# Patient Record
Sex: Male | Born: 2000
Health system: Southern US, Community
[De-identification: ages and names within clinical notes are randomized; demographics above are authoritative.]

## PROBLEM LIST (undated history)

## (undated) DIAGNOSIS — L0100 Impetigo, unspecified: Secondary | ICD-10-CM

---
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
## 2021-01-05 ENCOUNTER — Other Ambulatory Visit: Payer: Self-pay

## 2021-01-05 ENCOUNTER — Ambulatory Visit
Admission: RE | Admit: 2021-01-05 | Discharge: 2021-01-05 | Disposition: A | Discharge status: Home / Self Care | Payer: No Typology Code available for payment source | Source: Ambulatory Visit | Attending: Emergency Medicine | Admitting: Emergency Medicine

## 2021-01-05 VITALS — BP 136/97 | HR 91 | Temp 98.7°F | Resp 18

## 2021-01-05 DIAGNOSIS — L0100 Impetigo, unspecified: Secondary | ICD-10-CM | POA: Diagnosis not present

## 2021-01-05 DIAGNOSIS — R030 Elevated blood-pressure reading, without diagnosis of hypertension: Secondary | ICD-10-CM

## 2021-01-05 HISTORY — DX: Impetigo, unspecified: L01.00

## 2021-01-05 MED ORDER — MUPIROCIN 2 % EX OINT: 1.0000 "application " | TOPICAL_OINTMENT | Freq: Two times a day (BID) | CUTANEOUS | 0 refills | Status: DC

## 2021-01-05 MED ORDER — SULFAMETHOXAZOLE-TRIMETHOPRIM 800-160 MG PO TABS: 1.0000 | ORAL_TABLET | Freq: Two times a day (BID) | ORAL | 0 refills | Status: AC

## 2021-01-05 NOTE — ED Provider Notes (Signed)
[name]    CSN: [id number] Arrival date & time: 01/05/21  1708      History   Chief Complaint Chief Complaint  Patient presents with   Rash   APPT 1715    HPI Hector Harris is a 20 y.o. male.  Patient presents with 2-week history of a rash on his lower back/upper buttocks.  The rash is getting worse and spreading.  He states it is pruritic but also tender.  He also reports yellow drainage and crusting.  Treatment attempted at home with cortisone cream.  He denies fever, chills, or other symptoms.  He states this is similar to impetigo that he had several years ago.  The history is provided by the patient and medical records.   Past Medical History:  Diagnosis Date   Impetigo     Patient Active Problem List   Diagnosis Date Noted   Obesity 12/30/2014   Elevated hemoglobin A1c 04/18/2014   Elevated LDL cholesterol level 04/18/2014   Gynecomastia 04/18/2014   Morbid obesity ([facility]) 04/18/2014    Past Surgical History:  Procedure Laterality Date   CIRCUMCISION N/A 2005       Home Medications    Prior to Admission medications   Medication Sig Start Date End Date Taking? Authorizing Provider  mupirocin ointment (BACTROBAN) 2 % Apply 1 application topically 2 (two) times daily. 01/05/21  Yes Mickie Bail, NP  sulfamethoxazole-trimethoprim (BACTRIM DS) 800-160 MG tablet Take 1 tablet by mouth 2 (two) times daily for 7 days. 01/05/21 01/12/21 Yes Mickie Bail, NP  chlorhexidine (PERIDEX) 0.12 % solution RINSE MOUTH WITH (1 CAPFUL) FOR 30 SECONDS EVERY MORNING AND EVERY EVENING AFTER TOOTHBRUSHING. EXPECTORATE AFTER RINSING, DO NOT SWALLOW 05/21/20 05/21/21  Rosendo Gros, DMD  tretinoin (RETIN-A) 0.025 % cream  04/26/16   [provider]  valACYclovir (VALTREX) 500 MG tablet take one tablet by mouth at first sign of attack, then 4 tablets 12 hours later. 11/11/20       Family History Family History  Problem Relation Age of Onset   Hyperlipidemia  Father    Obesity Father    Hypertension Father    Cancer Maternal Grandmother    Hyperlipidemia Maternal Grandmother    Hypertension Maternal Grandmother    Diabetes Paternal Grandmother    Hyperlipidemia Paternal Grandmother    Obesity Paternal Grandmother    Hypertension Paternal Grandmother    Diabetes Paternal Grandfather    Hyperlipidemia Paternal Grandfather    Obesity Paternal Grandfather    Hypertension Paternal Grandfather    Obesity Mother     Social History Social History   Tobacco Use   Smoking status: Never   Smokeless tobacco: Never  Vaping Use   Vaping Use: Never used  Substance Use Topics   Drug use: Yes    Types: Marijuana     Allergies   Patient has no known allergies.   Review of Systems Review of Systems  Constitutional:  Negative for chills and fever.  Respiratory:  Negative for cough and shortness of breath.   Cardiovascular:  Negative for chest pain and palpitations.  Gastrointestinal:  Negative for abdominal pain and vomiting.  Skin:  Positive for color change and rash.  All other systems reviewed and are negative.   Physical Exam Triage Vital Signs ED Triage Vitals  Enc Vitals Group     BP      Pulse      Resp      Temp  Temp src      SpO2      Weight      Height      Head Circumference      Peak Flow      Pain Score      Pain Loc      Pain Edu?      Excl. in GC?    No data found.  Updated Vital Signs BP (!) 136/97 (BP Location: Left Arm)   Pulse 91   Temp 98.7 F (37.1 C) (Oral)   Resp 18   SpO2 97%   Visual Acuity Right Eye Distance:   Left Eye Distance:   Bilateral Distance:    Right Eye Near:   Left Eye Near:    Bilateral Near:     Physical Exam Vitals and nursing note reviewed.  Constitutional:      General: He is not in acute distress.    Appearance: He is well-developed.  HENT:     Head: Normocephalic and atraumatic.     Mouth/Throat:     Mouth: Mucous membranes are moist.  Eyes:      Conjunctiva/sclera: Conjunctivae normal.  Cardiovascular:     Rate and Rhythm: Normal rate and regular rhythm.     Heart sounds: Normal heart sounds.  Pulmonary:     Effort: Pulmonary effort is normal. No respiratory distress.     Breath sounds: Normal breath sounds.  Abdominal:     Palpations: Abdomen is soft.     Tenderness: There is no abdominal tenderness.  Musculoskeletal:        General: Normal range of motion.     Cervical back: Neck supple.  Skin:    General: Skin is warm and dry.     Findings: Erythema and lesion present.     Comments: Red papular and patchy rash on lower back and upper buttocks.  Honey colored crusting; no current drainage.  Localized erythema.  Neurological:     General: No focal deficit present.     Mental Status: He is alert and oriented to person, place, and time.     Sensory: No sensory deficit.     Motor: No weakness.     Gait: Gait normal.  Psychiatric:        Mood and Affect: Mood normal.        Behavior: Behavior normal.     UC Treatments / Results  Labs (all labs ordered are listed, but only abnormal results are displayed) Labs Reviewed - No data to display  EKG   Radiology No results found.  Procedures Procedures (including critical care time)  Medications Ordered in UC Medications - No data to display  Initial Impression / Assessment and Plan / UC Course  I have reviewed the triage vital signs and the nursing notes.  Pertinent labs & imaging results that were available during my care of the patient were reviewed by me and considered in my medical decision making (see chart for details).  Impetigo.  Elevated blood pressure reading.  Treating with Bactrim DS and mupirocin ointment.  Wound care instructions and signs of worsening infection discussed.  Instructed patient to follow-up with his PCP if his symptoms are not improving.  Also discussed that his blood pressure is elevated today needs to be rechecked by his PCP in 2 to 4  weeks.  He agrees to plan of care.   Final Clinical Impressions(s) / UC Diagnoses   Final diagnoses:  Impetigo  Elevated blood pressure reading  Discharge Instructions      Keep your wound clean and dry.  Wash it gently twice a day with soap and water.  Apply the antibiotic cream twice a day.  Take the antibiotic as directed.  Follow up with your primary care provider if your symptoms are not improving.    Your blood pressure is elevated today at 136/97.  Please have this rechecked by your primary care provider in 2-4 weeks.          ED Prescriptions     Medication Sig Dispense Auth. Provider   mupirocin ointment (BACTROBAN) 2 % Apply 1 application topically 2 (two) times daily. 22 g Mickie Bail, NP   sulfamethoxazole-trimethoprim (BACTRIM DS) 800-160 MG tablet Take 1 tablet by mouth 2 (two) times daily for 7 days. 14 tablet Mickie Bail, NP      PDMP not reviewed this encounter.   Mickie Bail, NP 01/05/21 [phone]

## 2021-01-05 NOTE — ED Triage Notes (Addendum)
Patient c/o rash x 2 weeks.   Patient endorses rash has worsened since onset of symptoms. Patient states " it seems to be spreading".   Patient endorses itching.Patient endorses "yellow drainage" from some sites.   Patient endorses rash is present on "back and top of butt".   Patient also has rash present on LFT upper arm and LFT leg.   Patient has used cortisone cream w/ no relief of symptoms.

## 2021-01-05 NOTE — Discharge Instructions (Addendum)
Keep your wound clean and dry.  Wash it gently twice a day with soap and water.  Apply the antibiotic cream twice a day.  Take the antibiotic as directed.  Follow up with your primary care provider if your symptoms are not improving.    Your blood pressure is elevated today at 136/97.  Please have this rechecked by your primary care provider in 2-4 weeks.

## 2021-03-03 ENCOUNTER — Other Ambulatory Visit (HOSPITAL_COMMUNITY): Payer: Self-pay

## 2021-04-04 ENCOUNTER — Other Ambulatory Visit: Payer: Self-pay

## 2021-04-06 ENCOUNTER — Other Ambulatory Visit: Payer: Self-pay

## 2021-06-23 ENCOUNTER — Other Ambulatory Visit: Payer: Self-pay

## 2021-06-23 ENCOUNTER — Ambulatory Visit
Admission: EM | Admit: 2021-06-23 | Discharge: 2021-06-23 | Disposition: A | Discharge status: Home / Self Care | Payer: No Typology Code available for payment source | Attending: Emergency Medicine | Admitting: Emergency Medicine

## 2021-06-23 ENCOUNTER — Encounter: Payer: Self-pay | Admitting: Emergency Medicine

## 2021-06-23 DIAGNOSIS — R300 Dysuria: Secondary | ICD-10-CM | POA: Insufficient documentation

## 2021-06-23 DIAGNOSIS — Z113 Encounter for screening for infections with a predominantly sexual mode of transmission: Secondary | ICD-10-CM | POA: Diagnosis not present

## 2021-06-23 LAB — POCT URINALYSIS DIP (MANUAL ENTRY)
Bilirubin, UA: NEGATIVE
Blood, UA: NEGATIVE
Glucose, UA: NEGATIVE mg/dL
Ketones, POC UA: NEGATIVE mg/dL
Leukocytes, UA: NEGATIVE
Nitrite, UA: NEGATIVE
Protein Ur, POC: NEGATIVE mg/dL
Spec Grav, UA: 1.025 (ref 1.010–1.025)
Urobilinogen, UA: 0.2 E.U./dL
pH, UA: 6.5 (ref 5.0–8.0)

## 2021-06-23 NOTE — Discharge Instructions (Addendum)
Your urine is normal.    Your other tests are pending.  If your test results are positive, we will call you.  You may require treatment at that time.  Do not have sexual activity for at least 7 days.    Follow up with your primary care provider if your symptoms are not improving.

## 2021-06-23 NOTE — ED Triage Notes (Signed)
Pt here with burning during urination x 2 weeks. Pt is sexually active, but denies any new sexual partners. Denies penile discharge, odor, abdominal pain or testicular pain.

## 2021-06-23 NOTE — ED Provider Notes (Signed)
Hector Harris    CSN: [id number] Arrival date & time: 06/23/21  1816      History   Chief Complaint Chief Complaint  Patient presents with   Dysuria    HPI Hector Harris is a 21 y.o. male.  Patient presents with 2-week history of burning with urination.  He is sexually active in a monogamous relationship and does not use condoms.  He denies penile discharge, testicular pain, fever, chills, rash, hematuria, abdominal pain, flank pain, or other symptoms.  No treatments at home.  The history is provided by the patient and medical records.   Past Medical History:  Diagnosis Date   Impetigo     Patient Active Problem List   Diagnosis Date Noted   Obesity 12/30/2014   Elevated hemoglobin A1c 04/18/2014   Elevated LDL cholesterol level 04/18/2014   Gynecomastia 04/18/2014   Morbid obesity ([place]) 04/18/2014    Past Surgical History:  Procedure Laterality Date   CIRCUMCISION N/A 2005       Home Medications    Prior to Admission medications   Medication Sig Start Date End Date Taking? Authorizing Provider  mupirocin ointment (BACTROBAN) 2 % Apply 1 application topically 2 (two) times daily. 01/05/21   Sharion Balloon, NP  tretinoin (RETIN-A) 0.025 % cream  04/26/16   [provider]  valACYclovir (VALTREX) 500 MG tablet take 4 tablets by mouth at first sign of attack, then 4 tablets 12 hours later. 11/11/20       Family History Family History  Problem Relation Age of Onset   Hyperlipidemia Father    Obesity Father    Hypertension Father    Cancer Maternal Grandmother    Hyperlipidemia Maternal Grandmother    Hypertension Maternal Grandmother    Diabetes Paternal Grandmother    Hyperlipidemia Paternal Grandmother    Obesity Paternal Grandmother    Hypertension Paternal Grandmother    Diabetes Paternal Grandfather    Hyperlipidemia Paternal Grandfather    Obesity Paternal Grandfather    Hypertension Paternal Grandfather    Obesity Mother      Social History Social History   Tobacco Use   Smoking status: Never   Smokeless tobacco: Never  Vaping Use   Vaping Use: Never used  Substance Use Topics   Drug use: Yes    Types: Marijuana     Allergies   Patient has no known allergies.   Review of Systems Review of Systems  Constitutional:  Negative for chills and fever.  Gastrointestinal:  Negative for abdominal pain, nausea and vomiting.  Genitourinary:  Positive for dysuria. Negative for flank pain, hematuria, penile discharge and testicular pain.  Skin:  Negative for color change and rash.  All other systems reviewed and are negative.   Physical Exam Triage Vital Signs ED Triage Vitals  Enc Vitals Group     BP 06/23/21 1855 (!) 163/89     Pulse Rate 06/23/21 1855 (!) 102     Resp 06/23/21 1855 18     Temp 06/23/21 1855 98.1 F (36.7 C)     Temp src --      SpO2 06/23/21 1855 98 %     Weight --      Height --      Head Circumference --      Peak Flow --      Pain Score 06/23/21 1858 4     Pain Loc --      Pain Edu? --      Excl. in  GC? --    No data found.  Updated Vital Signs BP (!) 163/89 (BP Location: Left Arm)    Pulse (!) 102    Temp 98.1 F (36.7 C)    Resp 18    SpO2 98%   Visual Acuity Right Eye Distance:   Left Eye Distance:   Bilateral Distance:    Right Eye Near:   Left Eye Near:    Bilateral Near:     Physical Exam Vitals and nursing note reviewed.  Constitutional:      General: He is not in acute distress.    Appearance: He is well-developed. He is not ill-appearing.  HENT:     Mouth/Throat:     Mouth: Mucous membranes are moist.  Cardiovascular:     Rate and Rhythm: Normal rate and regular rhythm.     Heart sounds: Normal heart sounds.  Pulmonary:     Effort: Pulmonary effort is normal. No respiratory distress.     Breath sounds: Normal breath sounds.  Abdominal:     Palpations: Abdomen is soft.     Tenderness: There is no abdominal tenderness. There is no right  CVA tenderness, left CVA tenderness, guarding or rebound.  Genitourinary:    Penis: Normal.      Testes: Normal.  Musculoskeletal:     Cervical back: Neck supple.  Skin:    General: Skin is warm and dry.     Findings: No lesion or rash.  Neurological:     Mental Status: He is alert.  Psychiatric:        Mood and Affect: Mood normal.        Behavior: Behavior normal.     UC Treatments / Results  Labs (all labs ordered are listed, but only abnormal results are displayed) Labs Reviewed  POCT URINALYSIS DIP (MANUAL ENTRY)  CYTOLOGY, (ORAL, ANAL, URETHRAL) ANCILLARY ONLY    EKG   Radiology No results found.  Procedures Procedures (including critical care time)  Medications Ordered in UC Medications - No data to display  Initial Impression / Assessment and Plan / UC Course  I have reviewed the triage vital signs and the nursing notes.  Pertinent labs & imaging results that were available during my care of the patient were reviewed by me and considered in my medical decision making (see chart for details).   Dysuria, screening for STDs.  Urine normal.  Patient obtained urethral self swab for testing.  Discussed that we will call if test results are positive.  Discussed that he may require treatment at that time.  Instructed patient to abstain from sexual activity for at least 7 days.  Instructed him to follow-up with PCP if his symptoms are not improving.  Patient agrees to plan of care.    Final Clinical Impressions(s) / UC Diagnoses   Final diagnoses:  Dysuria  Screening for STD (sexually transmitted disease)     Discharge Instructions      Your urine is normal.    Your other tests are pending.  If your test results are positive, we will call you.  You may require treatment at that time.  Do not have sexual activity for at least 7 days.    Follow up with your primary care provider if your symptoms are not improving.        ED Prescriptions   None     PDMP not reviewed this encounter.   Sharion Balloon, NP 06/23/21 1930

## 2021-06-24 LAB — CYTOLOGY, (ORAL, ANAL, URETHRAL) ANCILLARY ONLY
Chlamydia: POSITIVE — AB
Comment: NEGATIVE
Comment: NEGATIVE
Comment: NORMAL
Neisseria Gonorrhea: NEGATIVE
Trichomonas: NEGATIVE

## 2021-06-25 ENCOUNTER — Telehealth (HOSPITAL_COMMUNITY): Payer: Self-pay | Admitting: Emergency Medicine

## 2021-06-25 MED ORDER — DOXYCYCLINE HYCLATE 100 MG PO CAPS: 100.0000 mg | ORAL_CAPSULE | Freq: Two times a day (BID) | ORAL | 0 refills | Status: AC

## 2021-07-27 ENCOUNTER — Encounter: Payer: Self-pay | Admitting: Emergency Medicine

## 2021-07-27 ENCOUNTER — Ambulatory Visit
Admission: EM | Admit: 2021-07-27 | Discharge: 2021-07-27 | Disposition: A | Discharge status: Home / Self Care | Payer: No Typology Code available for payment source | Attending: Emergency Medicine | Admitting: Emergency Medicine

## 2021-07-27 ENCOUNTER — Other Ambulatory Visit: Payer: Self-pay

## 2021-07-27 DIAGNOSIS — R300 Dysuria: Secondary | ICD-10-CM | POA: Insufficient documentation

## 2021-07-27 MED ORDER — DOXYCYCLINE HYCLATE 100 MG PO CAPS: 100.0000 mg | ORAL_CAPSULE | Freq: Two times a day (BID) | ORAL | 0 refills | Status: DC

## 2021-07-27 NOTE — ED Triage Notes (Signed)
Pt c/o dysuria. Started last week. He states he as recently treated for chlamydia and finished the medication but the symptoms started coming back last week. Denies penile discharge.   ?

## 2021-07-27 NOTE — Discharge Instructions (Signed)
Today you are being treated prophylactically for chlamydia, take doxycycline twice a day for the next 7 days ? ?Labs pending 2-3 days, you will be contacted if positive for any sti and treatment will be sent to the pharmacy, you will have to return to the clinic if positive for gonorrhea to receive treatment  ? ?Please refrain from having sex until labs results, if positive please refrain from having sex until treatment complete and symptoms resolve  ? ?If positive for Chlamydia  gonorrhea or trichomoniasis please notify partner or partners so they may tested as well ? ?Moving forward, it is recommended you use some form of protection against the transmission of sti infections  such as condoms or dental dams with each sexual encounter   ?

## 2021-07-27 NOTE — ED Provider Notes (Signed)
?[place] ? ? ? ?CSN: [id number] ?Arrival date & time: 07/27/21  1722 ? ? ?  ? ?History   ?Chief Complaint ?Chief Complaint  ?Patient presents with  ? Exposure to STD  ? Dysuria  ? ? ?HPI ?Hector Harris is a 21 y.o. male.  ? ?Patient presents with dysuria and urinary frequency for 1 week.  Denies urinary urgency, hematuria, abdominal pain, flank pain, penile or testicle swelling, fever, chills, penile discharge.  Sexually active, 1 partner, sometimes condom use.  Endorses that he was treated for chlamydia 1 month ago and that his partner completed treatment as well. ? ?Past Medical History:  ?Diagnosis Date  ? Impetigo   ? ? ?Patient Active Problem List  ? Diagnosis Date Noted  ? Obesity 12/30/2014  ? Elevated hemoglobin A1c 04/18/2014  ? Elevated LDL cholesterol level 04/18/2014  ? Gynecomastia 04/18/2014  ? Morbid obesity ([place]) 04/18/2014  ? ? ?Past Surgical History:  ?Procedure Laterality Date  ? CIRCUMCISION N/A 2005  ? ? ? ? ? ?Home Medications   ? ?Prior to Admission medications   ?Medication Sig Start Date End Date Taking? Authorizing Provider  ?doxycycline (VIBRAMYCIN) 100 MG capsule Take 1 capsule (100 mg total) by mouth 2 (two) times daily. 07/27/21  Yes Hans Eden, NP  ? ? ?Family History ?Family History  ?Problem Relation Age of Onset  ? Hyperlipidemia Father   ? Obesity Father   ? Hypertension Father   ? Cancer Maternal Grandmother   ? Hyperlipidemia Maternal Grandmother   ? Hypertension Maternal Grandmother   ? Diabetes Paternal Grandmother   ? Hyperlipidemia Paternal Grandmother   ? Obesity Paternal Grandmother   ? Hypertension Paternal Grandmother   ? Diabetes Paternal Grandfather   ? Hyperlipidemia Paternal Grandfather   ? Obesity Paternal Grandfather   ? Hypertension Paternal Grandfather   ? Obesity Mother   ? ? ?Social History ?Social History  ? ?Tobacco Use  ? Smoking status: Never  ? Smokeless tobacco: Never  ?Vaping Use  ? Vaping Use: Never used  ?Substance Use Topics  ?  Alcohol use: Not Currently  ? Drug use: Yes  ?  Types: Marijuana  ? ? ? ?Allergies   ?Patient has no known allergies. ? ? ?Review of Systems ?Review of Systems  ?Constitutional: Negative.   ?Respiratory: Negative.    ?Cardiovascular: Negative.   ?Genitourinary:  Positive for dysuria and frequency. Negative for decreased urine volume, difficulty urinating, enuresis, flank pain, genital sores, hematuria, penile discharge, penile pain, penile swelling, scrotal swelling, testicular pain and urgency.  ?Skin: Negative.   ? ? ?Physical Exam ?Triage Vital Signs ?ED Triage Vitals [07/27/21 1758]  ?Enc Vitals Group  ?   BP (!) 148/98  ?   Pulse Rate 61  ?   Resp 18  ?   Temp (!) 97.5 ?F (36.4 ?C)  ?   Temp Source Oral  ?   SpO2 100 %  ?   Weight 235 lb 0.2 oz (106.6 kg)  ?   Height 5\' 10"  (1.778 m)  ?   Head Circumference   ?   Peak Flow   ?   Pain Score 0  ?   Pain Loc   ?   Pain Edu?   ?   Excl. in [place]?   ? ?No data found. ? ?Updated Vital Signs ?BP (!) 148/98 (BP Location: Left Arm)   Pulse 61   Temp (!) 97.5 ?F (36.4 ?C) (Oral)   Resp 18  Ht 5\' 10"  (1.778 m)   Wt 235 lb 0.2 oz (106.6 kg)   SpO2 100%   BMI 33.72 kg/m?  ? ?Visual Acuity ?Right Eye Distance:   ?Left Eye Distance:   ?Bilateral Distance:   ? ?Right Eye Near:   ?Left Eye Near:    ?Bilateral Near:    ? ?Physical Exam ?Constitutional:   ?   Appearance: Normal appearance.  ?HENT:  ?   Head: Normocephalic.  ?Eyes:  ?   Extraocular Movements: Extraocular movements intact.  ?Pulmonary:  ?   Effort: Pulmonary effort is normal.  ?Genitourinary: ?   Comments: Deferred ?Skin: ?   General: Skin is warm and dry.  ?Neurological:  ?   Mental Status: He is alert and oriented to person, place, and time. Mental status is at baseline.  ?Psychiatric:     ?   Mood and Affect: Mood normal.     ?   Behavior: Behavior normal.  ? ? ? ?UC Treatments / Results  ?Labs ?(all labs ordered are listed, but only abnormal results are displayed) ?Labs Reviewed  ?CYTOLOGY, (ORAL, ANAL,  URETHRAL) ANCILLARY ONLY  ? ? ?EKG ? ? ?Radiology ?No results found. ? ?Procedures ?Procedures (including critical care time) ? ?Medications Ordered in UC ?Medications - No data to display ? ?Initial Impression / Assessment and Plan / UC Course  ?I have reviewed the triage vital signs and the nursing notes. ? ?Pertinent labs & imaging results that were available during my care of the patient were reviewed by me and considered in my medical decision making (see chart for details). ? ?Dysuria ? ?We will prophylactically treat for chlamydia while labs are pending, doxycycline 7-day course prescribed, discussed administration, STI labs pending, advised abstinence until labs have resulted, treatment is complete and all symptoms have resolved, if positive advise notification of partner for treatment as well, may follow-up at urgent care as needed ?Final Clinical Impressions(s) / UC Diagnoses  ? ?Final diagnoses:  ?Dysuria  ? ? ? ?Discharge Instructions   ? ?  ?Today you are being treated prophylactically for chlamydia, take doxycycline twice a day for the next 7 days ? ?Labs pending 2-3 days, you will be contacted if positive for any sti and treatment will be sent to the pharmacy, you will have to return to the clinic if positive for gonorrhea to receive treatment  ? ?Please refrain from having sex until labs results, if positive please refrain from having sex until treatment complete and symptoms resolve  ? ?If positive for Chlamydia  gonorrhea or trichomoniasis please notify partner or partners so they may tested as well ? ?Moving forward, it is recommended you use some form of protection against the transmission of sti infections  such as condoms or dental dams with each sexual encounter   ? ? ?ED Prescriptions   ? ? Medication Sig Dispense Auth. Provider  ? doxycycline (VIBRAMYCIN) 100 MG capsule Take 1 capsule (100 mg total) by mouth 2 (two) times daily. 14 capsule Hans Eden, NP  ? ?  ? ?PDMP not reviewed  this encounter. ?  ?Hans Eden, NP ?07/27/21 1824 ? ?

## 2021-07-28 LAB — CYTOLOGY, (ORAL, ANAL, URETHRAL) ANCILLARY ONLY
Chlamydia: POSITIVE — AB
Comment: NEGATIVE
Comment: NEGATIVE
Comment: NORMAL
Neisseria Gonorrhea: NEGATIVE
Trichomonas: NEGATIVE

## 2021-08-13 ENCOUNTER — Other Ambulatory Visit: Payer: Self-pay

## 2021-08-13 ENCOUNTER — Ambulatory Visit
Admission: EM | Admit: 2021-08-13 | Discharge: 2021-08-13 | Disposition: A | Discharge status: Home / Self Care | Payer: No Typology Code available for payment source | Attending: Student | Admitting: Student

## 2021-08-13 DIAGNOSIS — Z113 Encounter for screening for infections with a predominantly sexual mode of transmission: Secondary | ICD-10-CM | POA: Insufficient documentation

## 2021-08-13 DIAGNOSIS — Z202 Contact with and (suspected) exposure to infections with a predominantly sexual mode of transmission: Secondary | ICD-10-CM

## 2021-08-13 LAB — URINALYSIS, ROUTINE W REFLEX MICROSCOPIC
Bilirubin Urine: NEGATIVE
Glucose, UA: NEGATIVE mg/dL
Hgb urine dipstick: NEGATIVE
Ketones, ur: NEGATIVE mg/dL
Leukocytes,Ua: NEGATIVE
Nitrite: NEGATIVE
Protein, ur: NEGATIVE mg/dL
Specific Gravity, Urine: 1.02 (ref 1.005–1.030)
pH: 7 (ref 5.0–8.0)

## 2021-08-13 NOTE — ED Triage Notes (Signed)
Pt is here for STD testing after having penile burning and the frequency to go that started 2 weeks ago, pt has not taken any meds to relieve discomfort. ?

## 2021-08-13 NOTE — ED Provider Notes (Signed)
?[facility] ? ? ? ?CSN: [id number] ?Arrival date & time: 08/13/21  1729 ? ? ?  ? ?History   ?Chief Complaint ?Chief Complaint  ?Patient presents with  ? S74.5  ? ? ?HPI ?Hector Harris is a 21 y.o. male presenting for recheck of chlamydia.  He first tested positive for this 06/23/2021, did not take the doxycycline as directed and so was once again positive on 3/13.  This time, he did take the doxycycline as directed, and his symptoms have almost completely resolved.  There is still occasional dysuria, but no penile discharge, penile or testicular pain, penile or testicular rash, penile or testicular swelling, flank pain, abdominal pain, fever/chills.  States his partner has also been treated.  They sometimes use condoms. ? ?HPI ? ?Past Medical History:  ?Diagnosis Date  ? Impetigo   ? ? ?Patient Active Problem List  ? Diagnosis Date Noted  ? Obesity 12/30/2014  ? Elevated hemoglobin A1c 04/18/2014  ? Elevated LDL cholesterol level 04/18/2014  ? Gynecomastia 04/18/2014  ? Morbid obesity (HCC) 04/18/2014  ? ? ?Past Surgical History:  ?Procedure Laterality Date  ? CIRCUMCISION N/A 2005  ? ? ? ? ? ?Home Medications   ? ?Prior to Admission medications   ?Medication Sig Start Date End Date Taking? Authorizing Provider  ?doxycycline (VIBRAMYCIN) 100 MG capsule Take 1 capsule (100 mg total) by mouth 2 (two) times daily. 07/27/21   Valinda Hoar, NP  ? ? ?Family History ?Family History  ?Problem Relation Age of Onset  ? Hyperlipidemia Father   ? Obesity Father   ? Hypertension Father   ? Cancer Maternal Grandmother   ? Hyperlipidemia Maternal Grandmother   ? Hypertension Maternal Grandmother   ? Diabetes Paternal Grandmother   ? Hyperlipidemia Paternal Grandmother   ? Obesity Paternal Grandmother   ? Hypertension Paternal Grandmother   ? Diabetes Paternal Grandfather   ? Hyperlipidemia Paternal Grandfather   ? Obesity Paternal Grandfather   ? Hypertension Paternal Grandfather   ? Obesity Mother   ? ? ?Social  History ?Social History  ? ?Tobacco Use  ? Smoking status: Never  ? Smokeless tobacco: Never  ?Vaping Use  ? Vaping Use: Never used  ?Substance Use Topics  ? Alcohol use: Not Currently  ? Drug use: Yes  ?  Types: Marijuana  ? ? ? ?Allergies   ?Patient has no known allergies. ? ? ?Review of Systems ?Review of Systems  ?Constitutional:  Negative for chills and fever.  ?HENT:  Negative for sore throat.   ?Eyes:  Negative for pain and redness.  ?Respiratory:  Negative for shortness of breath.   ?Cardiovascular:  Negative for chest pain.  ?Gastrointestinal:  Negative for abdominal pain, diarrhea, nausea and vomiting.  ?Genitourinary:  Negative for decreased urine volume, difficulty urinating, dysuria, flank pain, frequency, genital sores, hematuria and urgency.  ?Musculoskeletal:  Negative for back pain.  ?Skin:  Negative for rash.  ?All other systems reviewed and are negative. ? ? ?Physical Exam ?Triage Vital Signs ?ED Triage Vitals  ?Enc Vitals Group  ?   BP 08/13/21 1838 137/82  ?   Pulse Rate 08/13/21 1838 72  ?   Resp 08/13/21 1838 19  ?   Temp 08/13/21 1838 98.2 ?F (36.8 ?C)  ?   Temp Source 08/13/21 1838 Oral  ?   SpO2 08/13/21 1838 100 %  ?   Weight --   ?   Height --   ?   Head Circumference --   ?  Peak Flow --   ?   Pain Score 08/13/21 1837 0  ?   Pain Loc --   ?   Pain Edu? --   ?   Excl. in GC? --   ? ?No data found. ? ?Updated Vital Signs ?BP 137/82 (BP Location: Left Arm)   Pulse 72   Temp 98.2 ?F (36.8 ?C) (Oral)   Resp 19   SpO2 100%  ? ?Visual Acuity ?Right Eye Distance:   ?Left Eye Distance:   ?Bilateral Distance:   ? ?Right Eye Near:   ?Left Eye Near:    ?Bilateral Near:    ? ?Physical Exam ?Vitals reviewed.  ?Constitutional:   ?   General: He is not in acute distress. ?   Appearance: Normal appearance. He is not ill-appearing or diaphoretic.  ?HENT:  ?   Head: Normocephalic and atraumatic.  ?Cardiovascular:  ?   Rate and Rhythm: Normal rate and regular rhythm.  ?   Heart sounds: Normal heart  sounds.  ?Pulmonary:  ?   Effort: Pulmonary effort is normal.  ?   Breath sounds: Normal breath sounds.  ?Skin: ?   General: Skin is warm.  ?Neurological:  ?   General: No focal deficit present.  ?   Mental Status: He is alert and oriented to person, place, and time.  ?Psychiatric:     ?   Mood and Affect: Mood normal.     ?   Behavior: Behavior normal.     ?   Thought Content: Thought content normal.     ?   Judgment: Judgment normal.  ? ? ? ?UC Treatments / Results  ?Labs ?(all labs ordered are listed, but only abnormal results are displayed) ?Labs Reviewed  ?URINALYSIS, ROUTINE W REFLEX MICROSCOPIC  ?CYTOLOGY, (ORAL, ANAL, URETHRAL) ANCILLARY ONLY  ? ? ?EKG ? ? ?Radiology ?No results found. ? ?Procedures ?Procedures (including critical care time) ? ?Medications Ordered in UC ?Medications - No data to display ? ?Initial Impression / Assessment and Plan / UC Course  ?I have reviewed the triage vital signs and the nursing notes. ? ?Pertinent labs & imaging results that were available during my care of the patient were reviewed by me and considered in my medical decision making (see chart for details). ? ?  ? ?This patient is a very pleasant 21 y.o. year old male presenting for STI screen - test of cure. Was treated for chlamydia with doxycycline 07/27/21 and completed the doxycycline as directed. His partner has also been treated. ? ?UA wnl, did not send culture. ? ?Will send self-swab for G/C, trich. Declines HIV, RPR. Safe sex precautions.  ? ?ED return precautions discussed. Patient verbalizes understanding and agreement.  ? ? ? ?Final Clinical Impressions(s) / UC Diagnoses  ? ?Final diagnoses:  ?Routine screening for STI (sexually transmitted infection)  ? ? ? ?Discharge Instructions   ? ?  ?Declines AVS ? ? ? ?ED Prescriptions   ?None ?  ? ?PDMP not reviewed this encounter. ?  ?Rhys Martini, PA-C ?08/13/21 1913 ? ?

## 2021-08-13 NOTE — Discharge Instructions (Addendum)
Declines AVS 

## 2021-08-14 ENCOUNTER — Encounter: Payer: Self-pay | Admitting: Nurse Practitioner

## 2021-08-14 ENCOUNTER — Ambulatory Visit (INDEPENDENT_AMBULATORY_CARE_PROVIDER_SITE_OTHER): Payer: No Typology Code available for payment source | Admitting: Nurse Practitioner

## 2021-08-14 VITALS — BP 128/86 | HR 69 | Temp 98.1°F | Resp 12 | Ht 69.5 in | Wt 231.2 lb

## 2021-08-14 DIAGNOSIS — Z6833 Body mass index (BMI) 33.0-33.9, adult: Secondary | ICD-10-CM

## 2021-08-14 DIAGNOSIS — Z0000 Encounter for general adult medical examination without abnormal findings: Secondary | ICD-10-CM | POA: Insufficient documentation

## 2021-08-14 DIAGNOSIS — F1290 Cannabis use, unspecified, uncomplicated: Secondary | ICD-10-CM

## 2021-08-14 DIAGNOSIS — E6609 Other obesity due to excess calories: Secondary | ICD-10-CM | POA: Diagnosis not present

## 2021-08-14 LAB — CBC WITH DIFFERENTIAL/PLATELET
Basophils Absolute: 0 10*3/uL (ref 0.0–0.1)
Basophils Relative: 0.8 % (ref 0.0–3.0)
Eosinophils Absolute: 0.3 10*3/uL (ref 0.0–0.7)
Eosinophils Relative: 5.8 % — ABNORMAL HIGH (ref 0.0–5.0)
HCT: 43.2 % (ref 39.0–52.0)
Hemoglobin: 14.8 g/dL (ref 13.0–17.0)
Lymphocytes Relative: 33 % (ref 12.0–46.0)
Lymphs Abs: 1.6 10*3/uL (ref 0.7–4.0)
MCHC: 34.2 g/dL (ref 30.0–36.0)
MCV: 88.7 fl (ref 78.0–100.0)
Monocytes Absolute: 0.6 10*3/uL (ref 0.1–1.0)
Monocytes Relative: 11.3 % (ref 3.0–12.0)
Neutro Abs: 2.4 10*3/uL (ref 1.4–7.7)
Neutrophils Relative %: 49.1 % (ref 43.0–77.0)
Platelets: 233 10*3/uL (ref 150.0–400.0)
RBC: 4.87 Mil/uL (ref 4.22–5.81)
RDW: 13.2 % (ref 11.5–14.6)
WBC: 4.9 10*3/uL (ref 4.5–10.5)

## 2021-08-14 LAB — COMPREHENSIVE METABOLIC PANEL
ALT: 15 U/L (ref 0–53)
AST: 16 U/L (ref 0–37)
Albumin: 4.5 g/dL (ref 3.5–5.2)
Alkaline Phosphatase: 42 U/L (ref 39–117)
BUN: 12 mg/dL (ref 6–23)
CO2: 27 mEq/L (ref 19–32)
Calcium: 9.5 mg/dL (ref 8.4–10.5)
Chloride: 104 mEq/L (ref 96–112)
Creatinine, Ser: 0.86 mg/dL (ref 0.40–1.50)
GFR: 125.01 mL/min (ref >60.00)
Glucose, Bld: 88 mg/dL (ref 70–99)
Potassium: 4 mEq/L (ref 3.5–5.1)
Sodium: 139 mEq/L (ref 135–145)
Total Bilirubin: 0.7 mg/dL (ref 0.2–1.2)
Total Protein: 6.5 g/dL (ref 6.0–8.3)

## 2021-08-14 LAB — LIPID PANEL
Cholesterol: 180 mg/dL (ref 0–200)
HDL: 57.6 mg/dL (ref >39.00)
LDL Cholesterol: 112 mg/dL — ABNORMAL HIGH (ref 0–99)
NonHDL: 122.3
Total CHOL/HDL Ratio: 3
Triglycerides: 54 mg/dL (ref 0.0–149.0)
VLDL: 10.8 mg/dL (ref 0.0–40.0)

## 2021-08-14 LAB — TSH: TSH: 0.84 u[IU]/mL (ref 0.35–5.50)

## 2021-08-14 LAB — HEMOGLOBIN A1C: Hgb A1c MFr Bld: 5.5 % (ref 4.6–6.5)

## 2021-08-14 NOTE — Assessment & Plan Note (Signed)
Discussed age-appropriate immunizations and screening exams.  Patient is due for second men B vaccine discussed this with patient he deferred at current time ?

## 2021-08-14 NOTE — Patient Instructions (Signed)
Nice to see you today ?I will be in touch in regards to the lab results once we have them ?Follow up with me in 1 year for your next physical, sooner if you need me ?

## 2021-08-14 NOTE — Progress Notes (Signed)
? ?New Patient Office Visit ? ?Subjective:  ?Patient ID: Hector Harris, male    DOB: 11/05/2000  Age: 21 y.o. MRN: [id number] ? ?CC:  ?Chief Complaint  ?Patient presents with  ? Establish Care  ?  Previous PCP Dr Volney American with pediatrician  ? ? ?HPI ?Hector Harris presents Establish care ?for complete physical and follow up of chronic conditions. ? ?Immunizations: ?-Tetanus:12/21/2019 ?-Influenza: refused ?-Covid-19: refused ?-Shingles: too young ?-Pneumonia: too young ? ? ? ?H:States he lives at home with his mother, father, and older sister. ?E:finished highschool.  States he went straight to the workforce works for [employer] currently. ?A: Patient states that he does have alcohol on the weekends several beers on Saturday.  States this is not at home is generally at a friend's house.  He states he does not drink and drive ?D: Currently using marijuana 4-5 times a week.  States that he does it just to calm down.  States that he is able to go to sleep without the use of marijuana. ?S: girlfriend. Using codom all the time and had a history of cl ?S: no history of mental health emergencies.  No history of self injury.  Patient denies HI/SI/AVH ? ? ?Diet: Fair diet. 2 meals and snack some. Sugar free red bull and then water ?Exercise: No regular exercise. On weekends with paintball for 3-4 hours ? ?Eye exam: Prn ?Dental exam: Completes semi-annually  ? ? ?Colonoscopy: too young ?Dexa: too young ?PSA: too young ? ?Lung Cancer Screening: Vaping since 2021.  States that he would like to quit.  States he is trying to work on weaning down.  Per patient report he just use nicotine based the liquids. ? ?Sleep: States goes to bed aroun 12 a.m. and get up around 730 a.m. Feels rested. Does snore. ? ? ?  08/14/2021  ?  9:20 AM  ?PHQ9 SCORE ONLY  ?PHQ-9 Total Score 0  ?  ? ? ?Past Medical History:  ?Diagnosis Date  ? Impetigo   ? ? ?Past Surgical History:  ?Procedure Laterality Date  ? CIRCUMCISION N/A 2005  ? ? ?Family History   ?Problem Relation Age of Onset  ? Hyperlipidemia Father   ?     history of this  ? Hypertension Father   ?     history of this  ? Hyperlipidemia Maternal Grandmother   ? Hypertension Maternal Grandmother   ? Breast cancer Maternal Grandmother   ? Diabetes Paternal Grandmother   ? Hyperlipidemia Paternal Grandmother   ? Obesity Paternal Grandmother   ? Hypertension Paternal Grandmother   ? Diabetes Paternal Grandfather   ? Hyperlipidemia Paternal Grandfather   ? Obesity Paternal Grandfather   ? Hypertension Paternal Grandfather   ? ? ?Social History  ? ?Socioeconomic History  ? Marital status: Single  ?  Spouse name: Not on file  ? Number of children: Not on file  ? Years of education: Not on file  ? Highest education level: Not on file  ?Occupational History  ? Not on file  ?Tobacco Use  ? Smoking status: Never  ? Smokeless tobacco: Never  ?Vaping Use  ? Vaping Use: Every day  ? Devices: started around 2021  ?Substance and Sexual Activity  ? Alcohol use: Yes  ?  Comment: on the weekends  ? Drug use: Yes  ?  Types: Marijuana  ? Sexual activity: Yes  ?  Birth control/protection: Condom  ?Other Topics Concern  ? Not on file  ?Social History Narrative  ?  Lives at home with mom, dad and sister, attends [employer] is in 7th grade.   ? ?Social Determinants of Health  ? ?Financial Resource Strain: Not on file  ?Food Insecurity: Not on file  ?Transportation Needs: Not on file  ?Physical Activity: Not on file  ?Stress: Not on file  ?Social Connections: Not on file  ?Intimate Partner Violence: Not on file  ? ? ?ROS ?Review of Systems  ?Constitutional:  Negative for chills, fatigue and fever.  ?Respiratory:  Negative for cough and shortness of breath.   ?Cardiovascular:  Negative for chest pain and leg swelling.  ?Gastrointestinal:  Negative for diarrhea, nausea and vomiting.  ?     BM daily  ?Genitourinary:  Negative for difficulty urinating, dysuria, frequency, penile discharge, penile pain, penile swelling, scrotal  swelling and testicular pain.  ?Musculoskeletal:  Positive for arthralgias.  ?Neurological:  Negative for dizziness, weakness, light-headedness, numbness and headaches.  ?Psychiatric/Behavioral:  Negative for hallucinations and suicidal ideas.   ? ?Objective:  ? ?Today's Vitals: BP 128/86   Pulse 69   Temp 98.1 ?F (36.7 ?C)   Resp 12   Ht 5' 9.5" (1.765 m)   Wt 231 lb 4 oz (104.9 kg)   SpO2 98%   BMI 33.66 kg/m?  ? ?Physical Exam ?Vitals and nursing note reviewed. Exam conducted with a chaperone present [facility], RMA).  ?Constitutional:   ?   Appearance: Normal appearance.  ?HENT:  ?   Right Ear: Tympanic membrane, ear canal and external ear normal.  ?   Left Ear: Tympanic membrane, ear canal and external ear normal.  ?   Mouth/Throat:  ?   Mouth: Mucous membranes are moist.  ?   Pharynx: Oropharynx is clear.  ?Eyes:  ?   Extraocular Movements: Extraocular movements intact.  ?   Pupils: Pupils are equal, round, and reactive to light.  ?Cardiovascular:  ?   Rate and Rhythm: Normal rate and regular rhythm.  ?   Pulses: Normal pulses.  ?   Heart sounds: Normal heart sounds.  ?Pulmonary:  ?   Effort: Pulmonary effort is normal.  ?   Breath sounds: Normal breath sounds.  ?Abdominal:  ?   General: Bowel sounds are normal. There is no distension.  ?   Palpations: There is no mass.  ?   Tenderness: There is no abdominal tenderness.  ?   Hernia: No hernia is present. There is no hernia in the left inguinal area or right inguinal area.  ?Genitourinary: ?   Penis: Normal and circumcised.   ?   Testes: Normal.  ?   Epididymis:  ?   Right: Normal.  ?   Left: Normal.  ?Musculoskeletal:  ?   Right lower leg: No edema.  ?   Left lower leg: No edema.  ?Lymphadenopathy:  ?   Cervical: No cervical adenopathy.  ?   Lower Body: No right inguinal adenopathy. No left inguinal adenopathy.  ?Skin: ?   General: Skin is warm.  ?Neurological:  ?   General: No focal deficit present.  ?   Mental Status: He is alert.  ?   Deep  Tendon Reflexes:  ?   Reflex Scores: ?     Bicep reflexes are 2+ on the right side and 2+ on the left side. ?     Patellar reflexes are 2+ on the right side and 2+ on the left side. ?   Comments: Bilateral upper and lower extremity strength 5/5  ?Psychiatric:     ?  Mood and Affect: Mood normal.     ?   Behavior: Behavior normal.     ?   Thought Content: Thought content normal.     ?   Judgment: Judgment normal.  ? ? ?Assessment & Plan:  ? ?Problem List Items Addressed This Visit   ?None ? ? ?Outpatient Encounter Medications as of 08/14/2021  ?Medication Sig  ? [DISCONTINUED] doxycycline (VIBRAMYCIN) 100 MG capsule Take 1 capsule (100 mg total) by mouth 2 (two) times daily. (Patient not taking: Reported on 08/14/2021)  ? ?No facility-administered encounter medications on file as of 08/14/2021.  ? ? ?Follow-up: No follow-ups on file.  ? ?Romilda Garret, NP ? ?

## 2021-08-14 NOTE — Assessment & Plan Note (Signed)
Patient does admit to using marijuana 4-5 times weekly.  States he does not the evenings before bed.  Did discuss the importance of discontinuation of marijuana use.  Inclusive of addiction, getting laced marijuana.  Patient acknowledged conversation.  Did also ask if he has to use it to go to sleep he denies that he has used marijuana to go to sleep states he can go to sleep without smoking marijuana ?

## 2021-08-16 ENCOUNTER — Other Ambulatory Visit: Payer: Self-pay

## 2021-08-16 ENCOUNTER — Emergency Department
Admission: EM | Admit: 2021-08-16 | Discharge: 2021-08-16 | Disposition: A | Discharge status: Home / Self Care | Payer: No Typology Code available for payment source | Attending: Emergency Medicine | Admitting: Emergency Medicine

## 2021-08-16 ENCOUNTER — Encounter: Payer: Self-pay | Admitting: Emergency Medicine

## 2021-08-16 ENCOUNTER — Emergency Department: Payer: No Typology Code available for payment source

## 2021-08-16 DIAGNOSIS — S0081XA Abrasion of other part of head, initial encounter: Secondary | ICD-10-CM | POA: Insufficient documentation

## 2021-08-16 DIAGNOSIS — Y92410 Unspecified street and highway as the place of occurrence of the external cause: Secondary | ICD-10-CM | POA: Insufficient documentation

## 2021-08-16 DIAGNOSIS — S0990XA Unspecified injury of head, initial encounter: Secondary | ICD-10-CM | POA: Diagnosis present

## 2021-08-16 DIAGNOSIS — S3992XA Unspecified injury of lower back, initial encounter: Secondary | ICD-10-CM | POA: Diagnosis not present

## 2021-08-16 MED ORDER — ACETAMINOPHEN 500 MG PO TABS
1000.0000 mg | ORAL_TABLET | Freq: Once | ORAL | Status: AC
  Administered 2021-08-16: 1000 mg via ORAL
  Filled 2021-08-16: qty 2
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
## 2021-08-16 NOTE — Discharge Instructions (Signed)
Your CAT scan did not show any injury to your head or neck.  You will be sore tomorrow.  You can take Tylenol and Motrin for pain.  If you develop any abdominal pain chest pain difficulty breathing, please return to the emergency department. ?

## 2021-08-16 NOTE — ED Notes (Signed)
C collar placed at this time.
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
## 2021-08-17 LAB — CYTOLOGY, (ORAL, ANAL, URETHRAL) ANCILLARY ONLY
Chlamydia: POSITIVE — AB
Comment: NEGATIVE
Comment: NEGATIVE
Comment: NORMAL
Neisseria Gonorrhea: NEGATIVE
Trichomonas: NEGATIVE

## 2021-08-31 ENCOUNTER — Ambulatory Visit: Payer: No Typology Code available for payment source | Admitting: Adult Health

## 2021-10-15 ENCOUNTER — Ambulatory Visit
Admission: EM | Admit: 2021-10-15 | Discharge: 2021-10-15 | Disposition: A | Discharge status: Home / Self Care | Payer: No Typology Code available for payment source | Attending: Nurse Practitioner | Admitting: Nurse Practitioner

## 2021-10-15 ENCOUNTER — Encounter: Payer: Self-pay | Admitting: Emergency Medicine

## 2021-10-15 DIAGNOSIS — R300 Dysuria: Secondary | ICD-10-CM | POA: Insufficient documentation

## 2021-10-15 DIAGNOSIS — Z113 Encounter for screening for infections with a predominantly sexual mode of transmission: Secondary | ICD-10-CM | POA: Diagnosis not present

## 2021-10-15 LAB — POCT URINALYSIS DIP (MANUAL ENTRY)
Bilirubin, UA: NEGATIVE
Blood, UA: NEGATIVE
Glucose, UA: NEGATIVE mg/dL
Ketones, POC UA: NEGATIVE mg/dL
Leukocytes, UA: NEGATIVE
Nitrite, UA: NEGATIVE
Protein Ur, POC: NEGATIVE mg/dL
Spec Grav, UA: 1.02 (ref 1.010–1.025)
Urobilinogen, UA: 1 E.U./dL
pH, UA: 7.5 (ref 5.0–8.0)

## 2021-10-15 NOTE — Discharge Instructions (Addendum)
Your urinalysis is negative today.  Urine culture and cytology results are pending.  If you have access to MyChart, you will be able to see the results there.  If your results are positive, you will be contacted to provide treatment. Increase fluids while symptoms persist.  You should be drinking 6-8 8 ounce glasses of water daily. Continue to increase your condom use. Follow-up if your symptoms worsen before your results are received.

## 2021-10-15 NOTE — ED Triage Notes (Signed)
Wants STD test.  Burning on urination x 2 weeks.

## 2021-10-15 NOTE — ED Provider Notes (Signed)
[facility]    CSN: [id number] Arrival date & time: 10/15/21  1018      History   Chief Complaint No chief complaint on file.   HPI Hector Harris is a 21 y.o. male.   The history is provided by the patient.  Patient presents for STI testing.  He states that he has had dysuria and penile irritation for the past 2 weeks.  He denies fever, chills, penile discharge, abdominal pain, testicular/scrotal pain/swelling, or back pain.  He states he has been with 1 male partner in the past 90 days.  Reports a previous history of chlamydia.  Patient states he has been treated for chlamydia in the past but does not the doxycycline has worked for him.  Past Medical History:  Diagnosis Date   Impetigo     Patient Active Problem List   Diagnosis Date Noted   Marijuana use 08/14/2021   Preventative health care 08/14/2021   Obesity 12/30/2014   Elevated hemoglobin A1c 04/18/2014   Elevated LDL cholesterol level 04/18/2014   Gynecomastia 04/18/2014   Morbid obesity (HCC) 04/18/2014    Past Surgical History:  Procedure Laterality Date   CIRCUMCISION N/A 2005       Home Medications    Prior to Admission medications   Not on File    Family History Family History  Problem Relation Age of Onset   Hyperlipidemia Father        history of this   Hypertension Father        history of this   Hyperlipidemia Maternal Grandmother    Hypertension Maternal Grandmother    Breast cancer Maternal Grandmother    Diabetes Paternal Grandmother    Hyperlipidemia Paternal Grandmother    Obesity Paternal Grandmother    Hypertension Paternal Grandmother    Diabetes Paternal Grandfather    Hyperlipidemia Paternal Grandfather    Obesity Paternal Grandfather    Hypertension Paternal Grandfather     Social History Social History   Tobacco Use   Smoking status: Never   Smokeless tobacco: Never  Vaping Use   Vaping Use: Every day   Substances: Nicotine   Devices: started  around 2021  Substance Use Topics   Alcohol use: Yes    Comment: on weekends he will drink 4-5 beers   Drug use: Yes    Types: Marijuana    Comment: 4-5 times a week before bed     Allergies   Patient has no known allergies.   Review of Systems Review of Systems Per HPI  Physical Exam Triage Vital Signs ED Triage Vitals [10/15/21 1123]  Enc Vitals Group     BP (!) 136/93     Pulse Rate (!) 55     Resp 18     Temp 98.4 F (36.9 C)     Temp Source Oral     SpO2 97 %     Weight      Height      Head Circumference      Peak Flow      Pain Score 3     Pain Loc      Pain Edu?      Excl. in GC?    No data found.  Updated Vital Signs BP (!) 136/93 (BP Location: Right Arm)   Pulse (!) 55   Temp 98.4 F (36.9 C) (Oral)   Resp 18   SpO2 97%   Visual Acuity Right Eye Distance:   Left Eye Distance:  Bilateral Distance:    Right Eye Near:   Left Eye Near:    Bilateral Near:     Physical Exam Vitals and nursing note reviewed.  Constitutional:      General: He is not in acute distress.    Appearance: Normal appearance.  HENT:     Head: Normocephalic.  Eyes:     Pupils: Pupils are equal, round, and reactive to light.  Cardiovascular:     Rate and Rhythm: Normal rate and regular rhythm.  Pulmonary:     Effort: Pulmonary effort is normal.     Breath sounds: Normal breath sounds.  Abdominal:     General: Bowel sounds are normal.     Palpations: Abdomen is soft.  Skin:    General: Skin is warm and dry.  Neurological:     General: No focal deficit present.     Mental Status: He is alert and oriented to person, place, and time.  Psychiatric:        Mood and Affect: Mood normal.        Behavior: Behavior normal.     UC Treatments / Results  Labs (all labs ordered are listed, but only abnormal results are displayed) Labs Reviewed  URINE CULTURE  POCT URINALYSIS DIP (MANUAL ENTRY)  CYTOLOGY, (ORAL, ANAL, URETHRAL) ANCILLARY ONLY     EKG   Radiology No results found.  Procedures Procedures (including critical care time)  Medications Ordered in UC Medications - No data to display  Initial Impression / Assessment and Plan / UC Course  I have reviewed the triage vital signs and the nursing notes.  Pertinent labs & imaging results that were available during my care of the patient were reviewed by me and considered in my medical decision making (see chart for details).  Patient presents requesting STI testing.  He complains of dysuria and penile irritation at the distal tip.  Symptoms have been present for the past 2 weeks.  Patient reports a history of chlamydia, but states he does not feel that the medication completely resolve the infection.  Cytology and urine culture results are pending.  Would like to wait to see if they are positive until treatment is provided based on the patient's current symptoms.  Supportive care recommendations were provided to the patient.  Advised to follow-up if symptoms worsen before results are received. Final Clinical Impressions(s) / UC Diagnoses   Final diagnoses:  Dysuria  Screening examination for sexually transmitted disease     Discharge Instructions      Your urinalysis is negative today.  Urine culture and cytology results are pending.  If you have access to MyChart, you will be able to see the results there.  If your results are positive, you will be contacted to provide treatment. Increase fluids while symptoms persist.  You should be drinking 6-8 8 ounce glasses of water daily. Continue to increase your condom use. Follow-up if your symptoms worsen before your results are received.     ED Prescriptions   None    PDMP not reviewed this encounter.   Abran Cantor, NP 10/15/21 1157

## 2021-10-16 LAB — CYTOLOGY, (ORAL, ANAL, URETHRAL) ANCILLARY ONLY
Chlamydia: NEGATIVE
Comment: NEGATIVE
Comment: NEGATIVE
Comment: NORMAL
Neisseria Gonorrhea: NEGATIVE
Trichomonas: NEGATIVE

## 2021-10-16 LAB — URINE CULTURE: Culture: NO GROWTH

## 2022-11-01 ENCOUNTER — Telehealth: Payer: 59 | Admitting: Physician Assistant

## 2022-11-01 DIAGNOSIS — B354 Tinea corporis: Secondary | ICD-10-CM

## 2022-11-01 MED ORDER — FLUCONAZOLE 150 MG PO TABS: 150.0000 mg | ORAL_TABLET | ORAL | 0 refills | Status: DC

## 2022-11-01 MED ORDER — CLOTRIMAZOLE-BETAMETHASONE 1-0.05 % EX CREA: 1.0000 | TOPICAL_CREAM | Freq: Every day | CUTANEOUS | 0 refills | Status: DC

## 2022-11-01 NOTE — Progress Notes (Signed)
E Visit for Rash  We are sorry that you are not feeling well. Here is how we plan to help!  It seems you have a substantial ring worm infection at present. This is highly contagious and can spread from one area of the body to another easily. Giving recently discovered sexually transmitted strain of this type of fungal infection, if you have recently had any new sexual partners, I would recommend being seen in person for some additional testing.   I have prescribed (1) a topical antifungal cream to apply as directed and (2) an oral medication to take as directed giving severe infection.  If not resolving, or any new or worsening symptoms, you need an in-person evaluation ASAP.    HOME CARE:  Take cool showers and avoid direct sunlight. Apply cool compress or wet dressings. Take an antihistamine like Benadryl for widespread rashes that itch.  The adult dose of Benadryl is 25-50 mg by mouth 4 times daily. Caution:  This type of medication may cause sleepiness.  Do not drink alcohol, drive, or operate dangerous machinery while taking antihistamines.  Do not take these medications if you have prostate enlargement.  Read package instructions thoroughly on all medications that you take.  GET HELP RIGHT AWAY IF:  Symptoms don't go away after treatment. Severe itching that persists. If you rash spreads or swells. If you rash begins to smell. If it blisters and opens or develops a yellow-brown crust. You develop a fever. You have a sore throat. You become short of breath.  MAKE SURE YOU:  Understand these instructions. Will watch your condition. Will get help right away if you are not doing well or get worse.  Thank you for choosing an e-visit.  Your e-visit answers were reviewed by a board certified advanced clinical practitioner to complete your personal care plan. Depending upon the condition, your plan could have included both over the counter or prescription medications.  Please  review your pharmacy choice. Make sure the pharmacy is open so you can pick up prescription now. If there is a problem, you may contact your provider through [employer] and have the prescription routed to another pharmacy.  Your safety is important to [place]. If you have drug allergies check your prescription carefully.   For the next 24 hours you can use MyChart to ask questions about today's visit, request a non-urgent call back, or ask for a work or school excuse. You will get an email in the next two days asking about your experience. I hope that your e-visit has been valuable and will speed your recovery.

## 2022-11-01 NOTE — Progress Notes (Signed)
I have spent 5 minutes in review of e-visit questionnaire, review and updating patient chart, medical decision making and response to patient.   Adar Rase Cody Davonna Ertl, PA-C    

## 2022-12-26 ENCOUNTER — Ambulatory Visit
Admission: EM | Admit: 2022-12-26 | Discharge: 2022-12-26 | Disposition: A | Discharge status: Home / Self Care | Payer: 59 | Attending: Emergency Medicine | Admitting: Emergency Medicine

## 2022-12-26 DIAGNOSIS — B354 Tinea corporis: Secondary | ICD-10-CM

## 2022-12-26 MED ORDER — FLUCONAZOLE 150 MG PO TABS: 150.0000 mg | ORAL_TABLET | ORAL | 0 refills | Status: DC

## 2022-12-26 MED ORDER — CLOTRIMAZOLE-BETAMETHASONE 1-0.05 % EX CREA: TOPICAL_CREAM | CUTANEOUS | 0 refills | Status: DC

## 2022-12-26 NOTE — Discharge Instructions (Addendum)
Take the Diflucan and use the Lotrisone cream as directed.  Follow up with your primary care provider or a dermatologist.

## 2022-12-26 NOTE — ED Triage Notes (Signed)
Patient presents to UC for ring worm rash. States he had e-visit 06/17 and prescribed medications. Ringworm rash returned.

## 2022-12-26 NOTE — ED Provider Notes (Signed)
Hector Harris    CSN: [id number] Arrival date & time: 12/26/22  1215      History   Chief Complaint Chief Complaint  Patient presents with   Rash    HPI Hector Harris is a 22 y.o. male.  Patient presents with 1 week history of pruritic lesions on his trunk and extremities.  He had these lesions 2 months ago; he had an e-visit and was prescribed antifungal medications for ringworm.  The lesions resolved with treatment but then returned a week ago.  The first one started on his right arm and he now has lesions on both legs and his back.  He denies fever or other symptoms.  Patient had an e-visit on 11/01/2022; diagnosed with tenia corporis; treated with Lotrisone and 2 weekly doses of Diflucan.  The history is provided by the patient and medical records.    Past Medical History:  Diagnosis Date   Impetigo     Patient Active Problem List   Diagnosis Date Noted   Marijuana use 08/14/2021   Preventative health care 08/14/2021   Obesity 12/30/2014   Elevated hemoglobin A1c 04/18/2014   Elevated LDL cholesterol level 04/18/2014   Gynecomastia 04/18/2014   Morbid obesity (HCC) 04/18/2014    Past Surgical History:  Procedure Laterality Date   CIRCUMCISION N/A 2005       Home Medications    Prior to Admission medications   Medication Sig Start Date End Date Taking? Authorizing Provider  clotrimazole-betamethasone (LOTRISONE) cream Apply to affected area daily. 12/26/22  Yes Mickie Bail, NP  fluconazole (DIFLUCAN) 150 MG tablet Take 1 tablet (150 mg total) by mouth once a week. 12/26/22  Yes Mickie Bail, NP    Family History Family History  Problem Relation Age of Onset   Hyperlipidemia Father        history of this   Hypertension Father        history of this   Hyperlipidemia Maternal Grandmother    Hypertension Maternal Grandmother    Breast cancer Maternal Grandmother    Diabetes Paternal Grandmother    Hyperlipidemia Paternal Grandmother    Obesity  Paternal Grandmother    Hypertension Paternal Grandmother    Diabetes Paternal Grandfather    Hyperlipidemia Paternal Grandfather    Obesity Paternal Grandfather    Hypertension Paternal Grandfather     Social History Social History   Tobacco Use   Smoking status: Never   Smokeless tobacco: Never  Vaping Use   Vaping status: Every Day   Substances: Nicotine   Devices: started around 2021  Substance Use Topics   Alcohol use: Yes    Comment: on weekends he will drink 4-5 beers   Drug use: Yes    Types: Marijuana    Comment: 4-5 times a week before bed     Allergies   Patient has no known allergies.   Review of Systems Review of Systems  Constitutional:  Negative for chills and fever.  HENT:  Negative for ear pain and sore throat.   Respiratory:  Negative for cough and shortness of breath.   Skin:  Positive for rash. Negative for color change.     Physical Exam Triage Vital Signs ED Triage Vitals  Encounter Vitals Group     BP 12/26/22 1242 (!) 143/96     Systolic BP Percentile --      Diastolic BP Percentile --      Pulse Rate 12/26/22 1242 74     Resp 12/26/22  1242 16     Temp 12/26/22 1242 98.2 F (36.8 C)     Temp Source 12/26/22 1242 Temporal     SpO2 12/26/22 1242 98 %     Weight --      Height --      Head Circumference --      Peak Flow --      Pain Score 12/26/22 1243 0     Pain Loc --      Pain Education --      Exclude from Growth Chart --    No data found.  Updated Vital Signs BP (!) 143/96 (BP Location: Left Arm)   Pulse 74   Temp 98.2 F (36.8 C) (Temporal)   Resp 16   SpO2 98%   Visual Acuity Right Eye Distance:   Left Eye Distance:   Bilateral Distance:    Right Eye Near:   Left Eye Near:    Bilateral Near:     Physical Exam Constitutional:      General: He is not in acute distress. HENT:     Mouth/Throat:     Mouth: Mucous membranes are moist.  Cardiovascular:     Rate and Rhythm: Normal rate and regular rhythm.      Heart sounds: Normal heart sounds.  Pulmonary:     Effort: Pulmonary effort is normal. No respiratory distress.     Breath sounds: Normal breath sounds.  Skin:    General: Skin is warm and dry.     Findings: Rash present.     Comments: Extremities and back: Few circular lesions with raised borders and lighter center.  No drainage.   Neurological:     Mental Status: He is alert.      UC Treatments / Results  Labs (all labs ordered are listed, but only abnormal results are displayed) Labs Reviewed - No data to display  EKG   Radiology No results found.  Procedures Procedures (including critical care time)  Medications Ordered in UC Medications - No data to display  Initial Impression / Assessment and Plan / UC Course  I have reviewed the triage vital signs and the nursing notes.  Pertinent labs & imaging results that were available during my care of the patient were reviewed by me and considered in my medical decision making (see chart for details).    Tinea corporis.  Patient has ringworm lesions on his arms, legs, back.  Treating with Diflucan weekly x 6 weeks and Lotrisone cream.  Instructed patient to follow-up with his PCP or a dermatologist due to his recurrent symptoms.  Education provided on body ringworm.  He agrees to plan of care.  Final Clinical Impressions(s) / UC Diagnoses   Final diagnoses:  Tinea corporis     Discharge Instructions      Take the Diflucan and use the Lotrisone cream as directed.  Follow up with your primary care provider or a dermatologist.        ED Prescriptions     Medication Sig Dispense Auth. Provider   clotrimazole-betamethasone (LOTRISONE) cream Apply to affected area daily. 30 g Mickie Bail, NP   fluconazole (DIFLUCAN) 150 MG tablet Take 1 tablet (150 mg total) by mouth once a week. 6 tablet Mickie Bail, NP      PDMP not reviewed this encounter.   Mickie Bail, NP 12/26/22 1351

## 2023-02-11 ENCOUNTER — Ambulatory Visit (INDEPENDENT_AMBULATORY_CARE_PROVIDER_SITE_OTHER): Payer: 59 | Admitting: Nurse Practitioner

## 2023-02-11 ENCOUNTER — Encounter: Payer: Self-pay | Admitting: Nurse Practitioner

## 2023-02-11 VITALS — BP 130/86 | HR 60 | Temp 98.1°F | Ht 69.0 in | Wt 242.6 lb

## 2023-02-11 DIAGNOSIS — Z79899 Other long term (current) drug therapy: Secondary | ICD-10-CM | POA: Diagnosis not present

## 2023-02-11 DIAGNOSIS — B354 Tinea corporis: Secondary | ICD-10-CM

## 2023-02-11 LAB — COMPREHENSIVE METABOLIC PANEL
ALT: 16 U/L (ref 0–53)
AST: 16 U/L (ref 0–37)
Albumin: 4.4 g/dL (ref 3.5–5.2)
Alkaline Phosphatase: 48 U/L (ref 39–117)
BUN: 12 mg/dL (ref 6–23)
CO2: 27 meq/L (ref 19–32)
Calcium: 9.4 mg/dL (ref 8.4–10.5)
Chloride: 107 meq/L (ref 96–112)
Creatinine, Ser: 0.83 mg/dL (ref 0.40–1.50)
GFR: 125.04 mL/min (ref >60.00)
Glucose, Bld: 96 mg/dL (ref 70–99)
Potassium: 4.3 meq/L (ref 3.5–5.1)
Sodium: 141 meq/L (ref 135–145)
Total Bilirubin: 0.8 mg/dL (ref 0.2–1.2)
Total Protein: 6.7 g/dL (ref 6.0–8.3)

## 2023-02-11 LAB — CBC
HCT: 46.9 % (ref 39.0–52.0)
Hemoglobin: 15.2 g/dL (ref 13.0–17.0)
MCHC: 32.4 g/dL (ref 30.0–36.0)
MCV: 90.7 fL (ref 78.0–100.0)
Platelets: 260 10*3/uL (ref 150.0–400.0)
RBC: 5.17 Mil/uL (ref 4.22–5.81)
RDW: 13.2 % (ref 11.5–15.5)
WBC: 6.4 10*3/uL (ref 4.0–10.5)

## 2023-02-11 LAB — HEMOGLOBIN A1C: Hgb A1c MFr Bld: 5.4 % (ref 4.6–6.5)

## 2023-02-11 MED ORDER — TERBINAFINE HCL 250 MG PO TABS: 250.0000 mg | ORAL_TABLET | Freq: Every day | ORAL | 0 refills | Status: DC

## 2023-02-11 NOTE — Patient Instructions (Signed)
Nice to see you today I will be in touch with the labs once I have reviewed I need to see you in 1 month

## 2023-02-11 NOTE — Progress Notes (Signed)
   Acute Office Visit  Subjective:     Patient ID: Hector Harris, male    DOB: December 06, 2000, 22 y.o.   MRN: [id number]  Chief Complaint  Patient presents with   ringwom    Pt complains of being all over his body and scalp. Pt states it is itchy and uncomfortable. Ongoing for 5 months. Did 6 wks of diflucan. States it never cleared up.      HPI Patient is in today for Rash with a history that is non contributory   Patient was seen at urgent care and dx with tinea corporis. States that he was dx with diflucan for two weeks and it helped and then it came back a week later. He was seen a UC an d prescirbed 6 week of diflucan that he finished last week. States that he has not had.  States that he had ring worm when he was 9.  States that he has impetigo in the past States that the currenlty lesions are ithcy States that he has them on the scalp, trunk, armput, groin, and buttock.  States that he has been using over the counter lortin that it did in the beginning and never really clears.   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 States that he will sweat all day   Has not tired any otc antihistamine  Review of Systems  Constitutional:  Negative for chills and fever.  Respiratory:  Negative for shortness of breath.   Cardiovascular:  Negative for chest pain.  Gastrointestinal:  Negative for abdominal pain, nausea and vomiting.  Skin:  Positive for itching and rash.        Objective:    BP 130/86   Pulse 60   Temp 98.1 F (36.7 C) (Temporal)   Ht 5\' 9"  (1.753 m)   Wt 242 lb 9.6 oz (110 kg)   SpO2 97%   BMI 35.83 kg/m    Physical Exam Vitals and nursing note reviewed. Exam conducted with a chaperone present Melina Copa, CMA).  Constitutional:      Appearance: Normal appearance.  Genitourinary:   Skin:    Findings: Lesion and rash present.       Neurological:     Mental Status: He is alert.          No results found for any visits on 02/11/23.       Assessment & Plan:   Problem List Items Addressed This Visit   None Visit Diagnoses     Tinea corporis    -  Primary   Relevant Medications   terbinafine (LAMISIL) 250 MG tablet   Other Relevant Orders   CBC   Comprehensive metabolic panel   Hemoglobin A1c   High risk medication use       Relevant Orders   CBC   Comprehensive metabolic panel       Meds ordered this encounter  Medications   terbinafine (LAMISIL) 250 MG tablet    Sig: Take 1 tablet (250 mg total) by mouth daily.    Dispense:  30 tablet    Refill:  0    Order Specific Question:   Supervising Provider    Answer:   Roxy Manns A [[id number]]    Return in about 4 weeks (around 03/11/2023) for tinea recheck .  Audria Nine, NP

## 2023-03-11 ENCOUNTER — Ambulatory Visit (INDEPENDENT_AMBULATORY_CARE_PROVIDER_SITE_OTHER): Payer: 59 | Admitting: Nurse Practitioner

## 2023-03-11 ENCOUNTER — Encounter: Payer: Self-pay | Admitting: Nurse Practitioner

## 2023-03-11 VITALS — BP 126/82 | HR 57 | Temp 98.3°F | Ht 69.0 in | Wt 245.6 lb

## 2023-03-11 DIAGNOSIS — Z79899 Other long term (current) drug therapy: Secondary | ICD-10-CM | POA: Diagnosis not present

## 2023-03-11 DIAGNOSIS — B354 Tinea corporis: Secondary | ICD-10-CM | POA: Insufficient documentation

## 2023-03-11 LAB — HEPATIC FUNCTION PANEL
ALT: 16 U/L (ref 0–53)
AST: 13 U/L (ref 0–37)
Albumin: 4.1 g/dL (ref 3.5–5.2)
Alkaline Phosphatase: 52 U/L (ref 39–117)
Bilirubin, Direct: 0.1 mg/dL (ref 0.0–0.3)
Total Bilirubin: 0.5 mg/dL (ref 0.2–1.2)
Total Protein: 6.1 g/dL (ref 6.0–8.3)

## 2023-03-11 MED ORDER — CLOTRIMAZOLE-BETAMETHASONE 1-0.05 % EX CREA: TOPICAL_CREAM | CUTANEOUS | 0 refills | Status: AC

## 2023-03-11 MED ORDER — TERBINAFINE HCL 250 MG PO TABS: 250.0000 mg | ORAL_TABLET | Freq: Every day | ORAL | 0 refills | Status: AC

## 2023-03-11 NOTE — Patient Instructions (Signed)
Nice to see you today I will refill the medication for another month You will need a lab visit for a month to recheck the liver.  Keep your dermatology appointment as scheduled

## 2023-03-11 NOTE — Progress Notes (Signed)
   Established Patient Office Visit  Subjective   Patient ID: Hector Harris, male    DOB: 20-Feb-2001  Age: 22 y.o. MRN: [id number]  Chief Complaint  Patient presents with   Follow-up    Pt states tinea corporis is mostly still there. Complains that everything is the same.     HPI  Tinea Corporis: patient has been treated with topical treatment, diflucan for 6 weeks worth of therapy. He was seen by me and placed on terbinafine 250mg  and is here for a follow up. Patient states that the rash is the same and has not improved at all.   States that he has been taking it as prescribed. States the itchin has improved some. States after the office visit he got a new lision but since then he has not had any more come up. He is not using any antihistamine he  using over the counter fungal cream,  States that he still has lesion on his penis but they have not gotten worse    Review of Systems  Constitutional:  Negative for chills and fever.  Respiratory:  Negative for shortness of breath.   Cardiovascular:  Negative for chest pain.  Skin:  Positive for itching and rash.  Neurological:  Negative for headaches.  Psychiatric/Behavioral:  Negative for hallucinations and suicidal ideas.       Objective:     BP 126/82   Pulse (!) 57   Temp 98.3 F (36.8 C) (Oral)   Ht 5\' 9"  (1.753 m)   Wt 245 lb 9.6 oz (111.4 kg)   SpO2 97%   BMI 36.27 kg/m    Physical Exam Vitals and nursing note reviewed.  Constitutional:      Appearance: Normal appearance.  Cardiovascular:     Rate and Rhythm: Normal rate and regular rhythm.     Heart sounds: Normal heart sounds.  Pulmonary:     Effort: Pulmonary effort is normal.     Breath sounds: Normal breath sounds.  Skin:    Findings: Lesion present.     Comments: See clinical photo  Neurological:     Mental Status: He is alert.         No results found for any visits on 03/11/23.    The ASCVD Risk score (Arnett DK, et al., 2019) failed to  calculate for the following reasons:   The 2019 ASCVD risk score is only valid for ages 67 to 44    Assessment & Plan:   Problem List Items Addressed This Visit       Musculoskeletal and Integument   Tinea corporis - Primary    Patient has been on terbinafine 250 mg for 1 month tolerating medication well near resolution of rash.  Will continue medication for 1 more month as it was refractory to Diflucan for 6 weeks.  Lotrisone cream refilled patient was instructed to avoid genital area.  Pending liver functions today.  Patient will also get liver functions rechecked once he finishes treatment      Relevant Medications   clotrimazole-betamethasone (LOTRISONE) cream   terbinafine (LAMISIL) 250 MG tablet   Other Relevant Orders   Hepatic function panel   Other Visit Diagnoses     High risk medication use       Relevant Orders   Hepatic function panel       Return if symptoms worsen or fail to improve.    Audria Nine, NP

## 2023-03-11 NOTE — Assessment & Plan Note (Signed)
Patient has been on terbinafine 250 mg for 1 month tolerating medication well near resolution of rash.  Will continue medication for 1 more month as it was refractory to Diflucan for 6 weeks.  Lotrisone cream refilled patient was instructed to avoid genital area.  Pending liver functions today.  Patient will also get liver functions rechecked once he finishes treatment
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
## 2023-04-09 ENCOUNTER — Encounter: Payer: Self-pay | Admitting: Nurse Practitioner

## 2023-04-11 ENCOUNTER — Other Ambulatory Visit: Payer: Self-pay | Admitting: Nurse Practitioner

## 2023-04-11 DIAGNOSIS — B354 Tinea corporis: Secondary | ICD-10-CM

## 2023-05-24 DIAGNOSIS — L309 Dermatitis, unspecified: Secondary | ICD-10-CM | POA: Diagnosis not present

## 2023-05-24 DIAGNOSIS — L578 Other skin changes due to chronic exposure to nonionizing radiation: Secondary | ICD-10-CM | POA: Diagnosis not present

## 2023-05-24 DIAGNOSIS — D225 Melanocytic nevi of trunk: Secondary | ICD-10-CM | POA: Diagnosis not present
